# Patient Record
Sex: Male | Born: 1995 | Race: White | Hispanic: No | Marital: Single | Smoking: Current every day smoker
Health system: Southern US, Community
[De-identification: ages and names within clinical notes are randomized; demographics above are authoritative.]

## PROBLEM LIST (undated history)

## (undated) DIAGNOSIS — J45909 Unspecified asthma, uncomplicated: Secondary | ICD-10-CM

## (undated) DIAGNOSIS — K829 Disease of gallbladder, unspecified: Secondary | ICD-10-CM

## (undated) HISTORY — PX: APPENDECTOMY: SHX54

---
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
## 2019-02-08 ENCOUNTER — Other Ambulatory Visit: Payer: Self-pay

## 2019-06-01 ENCOUNTER — Other Ambulatory Visit: Payer: Self-pay

## 2019-06-01 ENCOUNTER — Encounter (HOSPITAL_COMMUNITY): Payer: Self-pay | Admitting: Emergency Medicine

## 2019-06-01 ENCOUNTER — Emergency Department (HOSPITAL_COMMUNITY): Payer: Self-pay

## 2019-06-01 ENCOUNTER — Emergency Department (HOSPITAL_COMMUNITY)
Admission: EM | Admit: 2019-06-01 | Discharge: 2019-06-01 | Disposition: A | Discharge status: Home / Self Care | Payer: Self-pay | Attending: Emergency Medicine | Admitting: Emergency Medicine

## 2019-06-01 DIAGNOSIS — R05 Cough: Secondary | ICD-10-CM | POA: Insufficient documentation

## 2019-06-01 DIAGNOSIS — Z7722 Contact with and (suspected) exposure to environmental tobacco smoke (acute) (chronic): Secondary | ICD-10-CM | POA: Insufficient documentation

## 2019-06-01 DIAGNOSIS — J45901 Unspecified asthma with (acute) exacerbation: Secondary | ICD-10-CM | POA: Insufficient documentation

## 2019-06-01 LAB — CBC
HCT: 45.1 % (ref 39.0–52.0)
Hemoglobin: 14 g/dL (ref 13.0–17.0)
MCH: 28.2 pg (ref 26.0–34.0)
MCHC: 31 g/dL (ref 30.0–36.0)
MCV: 90.7 fL (ref 80.0–100.0)
Platelets: 360 10*3/uL (ref 150–400)
RBC: 4.97 MIL/uL (ref 4.22–5.81)
RDW: 13.7 % (ref 11.5–15.5)
WBC: 8.3 10*3/uL (ref 4.0–10.5)
nRBC: 0 % (ref 0.0–0.2)

## 2019-06-01 LAB — BASIC METABOLIC PANEL
Anion gap: 9 (ref 5–15)
BUN: 6 mg/dL (ref 6–20)
CO2: 30 mmol/L (ref 22–32)
Calcium: 9.5 mg/dL (ref 8.9–10.3)
Chloride: 101 mmol/L (ref 98–111)
Creatinine, Ser: 0.64 mg/dL (ref 0.61–1.24)
GFR calc Af Amer: >60 mL/min (ref >60)
GFR calc non Af Amer: >60 mL/min (ref >60)
Glucose, Bld: 85 mg/dL (ref 70–99)
Potassium: 4 mmol/L (ref 3.5–5.1)
Sodium: 140 mmol/L (ref 135–145)

## 2019-06-01 LAB — TROPONIN I (HIGH SENSITIVITY)
Troponin I (High Sensitivity): 3 ng/L (ref <18)
Troponin I (High Sensitivity): 4 ng/L (ref <18)

## 2019-06-01 LAB — D-DIMER, QUANTITATIVE: D-Dimer, Quant: <0.27 ug/mL-FEU (ref 0.00–0.50)

## 2019-06-01 MED ORDER — SODIUM CHLORIDE 0.9% FLUSH
3.0000 mL | Freq: Once | INTRAVENOUS | Status: AC
  Administered 2019-06-01: 3 mL via INTRAVENOUS

## 2019-06-01 MED ORDER — ALBUTEROL SULFATE HFA 108 (90 BASE) MCG/ACT IN AERS
4.0000 | INHALATION_SPRAY | Freq: Once | RESPIRATORY_TRACT | Status: AC
  Administered 2019-06-01: 4 via RESPIRATORY_TRACT
  Filled 2019-06-01: qty 6.7

## 2019-06-01 MED ORDER — ALBUTEROL SULFATE HFA 108 (90 BASE) MCG/ACT IN AERS
2.0000 | INHALATION_SPRAY | RESPIRATORY_TRACT | 0 refills | Status: DC | PRN
Start: 2019-06-01 — End: 2020-04-10

## 2019-06-01 MED ORDER — AEROCHAMBER PLUS FLO-VU LARGE MISC
Status: AC
  Filled 2019-06-01: qty 1

## 2019-06-01 MED ORDER — PREDNISONE 20 MG PO TABS
40.0000 mg | ORAL_TABLET | Freq: Every day | ORAL | 0 refills | Status: AC
Start: 2019-06-01 — End: 2019-06-06

## 2019-06-01 NOTE — ED Triage Notes (Signed)
Pt arrives to ED with c/o of breathing difficulty for over last 2 weeks was tested negative for covid yesterday. Pt has wheezing and states he was able to use a friend breathing treatment that did help but recently had to give it back. Pt denies any cp. Pt called ems out to his house today due to sob but they recommended he drive himself.   Pt also reports for the first time today he was was coughing up blood.

## 2019-06-01 NOTE — ED Notes (Signed)
Patient verbalizes understanding of discharge instructions. Opportunity for questioning and answers were provided. Armband removed by staff, pt discharged from ED to home 

## 2019-06-01 NOTE — ED Provider Notes (Signed)
[facility] EMERGENCY DEPARTMENT Provider Note   CSN: [id number] Arrival date & time: 06/01/19  1245     History Chief Complaint  Patient presents with  . Shortness of Breath    Dustin Velasquez is a 24 y.o. male.  Presents to ER with complaint of shortness of breath.  Patient states over the past couple months he has noted having intermittent difficulty in breathing, worse over the last couple weeks.  States that he had a Covid test performed yesterday that was negative.  States that he has wheezing and has been using friend's inhaler.  Some relief.  No associated chest pain.  Occasional cough, mostly nonproductive and nonbloody.  Denies any chronic medical conditions.  Denies prior history of asthma.  Smokes cigarettes, works in [employer].  HPI     Past Medical History:  Diagnosis Date  . Gallbladder attack     Patient Active Problem List   Diagnosis Date Noted  . Acute appendicitis 05/19/2014    Past Surgical History:  Procedure Laterality Date  . APPENDECTOMY     current r/c appe.  Marland Kitchen LAPAROSCOPIC APPENDECTOMY N/A 05/20/2014   Procedure: APPENDECTOMY LAPAROSCOPIC;  Surgeon: Donnie Mesa, MD;  Location: [place];  Service: General;  Laterality: N/A;       No family history on file.  Social History   Tobacco Use  . Smoking status: Passive Smoke Exposure - Never Smoker  . Smokeless tobacco: Never Used  Substance Use Topics  . Alcohol use: No  . Drug use: No    Home Medications Prior to Admission medications   Medication Sig Start Date End Date Taking? Authorizing Provider  albuterol (VENTOLIN HFA) 108 (90 Base) MCG/ACT inhaler Inhale 2 puffs into the lungs every 4 (four) hours as needed for wheezing or shortness of breath. 06/01/19   Lucrezia Starch, MD  ibuprofen (ADVIL,MOTRIN) 200 MG tablet Take 200 mg by mouth every 6 (six) hours as needed for moderate pain.    [provider]  ondansetron (ZOFRAN-ODT) 4 MG disintegrating tablet Take 1  tablet (4 mg total) by mouth stat. Patient not taking: Reported on 05/19/2014 07/01/13   Suezanne Jacquet, MD  oxyCODONE (OXY IR/ROXICODONE) 5 MG immediate release tablet Take 1 tablet (5 mg total) by mouth every 6 (six) hours as needed for moderate pain. 05/20/14   Nat Christen, PA-C  predniSONE (DELTASONE) 20 MG tablet Take 2 tablets (40 mg total) by mouth daily with breakfast for 5 days. 06/01/19 06/06/19  Lucrezia Starch, MD    Allergies    Patient has no known allergies.  Review of Systems   Review of Systems  Constitutional: Negative for chills and fever.  HENT: Negative for ear pain and sore throat.   Eyes: Negative for pain and visual disturbance.  Respiratory: Positive for cough and shortness of breath.   Cardiovascular: Negative for chest pain and palpitations.  Gastrointestinal: Negative for abdominal pain and vomiting.  Genitourinary: Negative for dysuria and hematuria.  Musculoskeletal: Negative for arthralgias and back pain.  Skin: Negative for color change and rash.  Neurological: Negative for seizures and syncope.  All other systems reviewed and are negative.   Physical Exam Updated Vital Signs BP 116/79   Pulse 84   Temp 97.6 F (36.4 C) (Oral)   Resp 13   Ht 5\' 2"  (1.575 m)   Wt 54.4 kg   SpO2 100%   BMI 21.95 kg/m   Physical Exam Vitals and nursing note reviewed.  Constitutional:      Appearance: He is well-developed.  HENT:     Head: Normocephalic and atraumatic.  Eyes:     Conjunctiva/sclera: Conjunctivae normal.  Cardiovascular:     Rate and Rhythm: Normal rate and regular rhythm.     Heart sounds: No murmur.  Pulmonary:     Comments: No respiratory distress, speaks in full sentences, no accessory muscle use, there is bilateral expiratory wheeze, no crackles Abdominal:     Palpations: Abdomen is soft.     Tenderness: There is no abdominal tenderness.  Musculoskeletal:     Cervical back: Neck supple.     Right lower leg: No edema.     Left  lower leg: No edema.  Skin:    General: Skin is warm and dry.  Neurological:     General: No focal deficit present.     Mental Status: He is alert and oriented to person, place, and time.     ED Results / Procedures / Treatments   Labs (all labs ordered are listed, but only abnormal results are displayed) Labs Reviewed  BASIC METABOLIC PANEL  CBC  D-DIMER, QUANTITATIVE (NOT AT [facility])  TROPONIN I (HIGH SENSITIVITY)  TROPONIN I (HIGH SENSITIVITY)    EKG EKG Interpretation  Date/Time:  Tuesday Jun 01 2019 10:04:30 EDT Ventricular Rate:  102 PR Interval:  130 QRS Duration: 74 QT Interval:  332 QTC Calculation: 432 R Axis:   78 Text Interpretation: Sinus tachycardia Otherwise normal ECG Confirmed by Marianna Fuss ([id number]) on 06/01/2019 3:07:16 PM   Radiology DG Chest 2 View  Result Date: 06/01/2019 CLINICAL DATA:  24 year old male with cough and shortness of breath for 2 months. Smoker/vapor. EXAM: CHEST - 2 VIEW COMPARISON:  CT Abdomen and Pelvis 05/19/2014. FINDINGS: Large lung volumes. Normal cardiac size and mediastinal contours. Visualized tracheal air column is within normal limits. No pneumothorax, pulmonary edema, pleural effusion or confluent pulmonary opacity. Mild diffuse increased pulmonary interstitial markings with a perihilar predominance. No acute osseous abnormality identified. Negative visible bowel gas pattern. IMPRESSION: Hyperinflated lungs. Mild prominence of the pulmonary interstitium likely secondary to smoking. No acute cardiopulmonary abnormality. Electronically Signed   By: Odessa Fleming M.D.   On: 06/01/2019 11:22    Procedures Procedures (including critical care time)  Medications Ordered in ED Medications  sodium chloride flush (NS) 0.9 % injection 3 mL (3 mLs Intravenous Given 06/01/19 1530)  albuterol (VENTOLIN HFA) 108 (90 Base) MCG/ACT inhaler 4 puff (4 puffs Inhalation Given 06/01/19 1545)  AeroChamber Plus Flo-Vu Large MISC (  Given 06/01/19 1545)      ED Course  I have reviewed the triage vital signs and the nursing notes.  Pertinent labs & imaging results that were available during my care of the patient were reviewed by me and considered in my medical decision making (see chart for details).    MDM Rules/Calculators/A&P                      24 year old male presents to ER with cough, shortness of breath.  On exam patient was well-appearing in no distress, vital signs were stable, no hypoxia, on auscultation noted significant expiratory wheeze bilateral.  Suspect patient symptoms are most likely secondary to asthma, reactive airway disease.  Patient denies being diagnosed with this previously. Gave albuterol with spacer here and had significant improvement. Notable social history smoker and works in [employer].  CXR negative for pneumonia.  D-dimer within normal limits, doubt PE.  EKG without  ischemic changes, troponin x2 within normal limits, doubt ACS.  Will give patient Rx for albuterol, steroid course.  Strongly recommended recheck with a primary care doctor, instructed return to ER for worsening symptoms. At present appropriate for out pt management.    After the discussed management above, the patient was determined to be safe for discharge.  The patient was in agreement with this plan and all questions regarding their care were answered.  ED return precautions were discussed and the patient will return to the ED with any significant worsening of condition.   Final Clinical Impression(s) / ED Diagnoses Final diagnoses:  Exacerbation of asthma, unspecified asthma severity, unspecified whether persistent    Rx / DC Orders ED Discharge Orders         Ordered    albuterol (VENTOLIN HFA) 108 (90 Base) MCG/ACT inhaler  Every 4 hours PRN     06/01/19 1644    predniSONE (DELTASONE) 20 MG tablet  Daily with breakfast     06/01/19 1644           Milagros Loll, MD 06/01/19 1649

## 2019-06-01 NOTE — Discharge Instructions (Signed)
Strongly recommend obtaining follow-up appointment with a primary care doctor.  If you do not have 1, in the interim you can follow-up with [facility] as in discharge instructions.  Take steroids as prescribed.  Use inhaler as needed.  If you feel your breathing worsens, you develop fever, other new concerning symptom, return to ER for reassessment.

## 2020-03-14 ENCOUNTER — Other Ambulatory Visit: Payer: Self-pay

## 2020-03-14 ENCOUNTER — Emergency Department: Payer: Self-pay

## 2020-03-14 ENCOUNTER — Emergency Department
Admission: EM | Admit: 2020-03-14 | Discharge: 2020-03-14 | Disposition: A | Discharge status: Home / Self Care | Payer: Self-pay | Attending: Student in an Organized Health Care Education/Training Program | Admitting: Student in an Organized Health Care Education/Training Program

## 2020-03-14 DIAGNOSIS — F17200 Nicotine dependence, unspecified, uncomplicated: Secondary | ICD-10-CM | POA: Insufficient documentation

## 2020-03-14 DIAGNOSIS — Z20822 Contact with and (suspected) exposure to covid-19: Secondary | ICD-10-CM | POA: Insufficient documentation

## 2020-03-14 DIAGNOSIS — J4521 Mild intermittent asthma with (acute) exacerbation: Secondary | ICD-10-CM | POA: Insufficient documentation

## 2020-03-14 HISTORY — DX: Unspecified asthma, uncomplicated: J45.909

## 2020-03-14 LAB — RESP PANEL BY RT-PCR (FLU A&B, COVID) ARPGX2
Influenza A by PCR: NEGATIVE
Influenza B by PCR: NEGATIVE
SARS Coronavirus 2 by RT PCR: NEGATIVE

## 2020-03-14 MED ORDER — ALBUTEROL SULFATE HFA 108 (90 BASE) MCG/ACT IN AERS
2.0000 | INHALATION_SPRAY | Freq: Four times a day (QID) | RESPIRATORY_TRACT | 2 refills | Status: DC | PRN
Start: 2020-03-14 — End: 2020-04-10

## 2020-03-14 MED ORDER — SODIUM CHLORIDE 0.9 % IV BOLUS: 1000.0000 mL | Freq: Once | INTRAVENOUS | Status: DC

## 2020-03-14 MED ORDER — PREDNISONE 10 MG PO TABS
10.0000 mg | ORAL_TABLET | Freq: Every day | ORAL | 0 refills | Status: DC
Start: 2020-03-14 — End: 2020-04-10

## 2020-03-14 MED ORDER — PREDNISONE 20 MG PO TABS
40.0000 mg | ORAL_TABLET | Freq: Every day | ORAL | 0 refills | Status: DC
Start: 2020-03-14 — End: 2020-03-14

## 2020-03-14 MED ORDER — IPRATROPIUM-ALBUTEROL 0.5-2.5 (3) MG/3ML IN SOLN
3.0000 mL | Freq: Once | RESPIRATORY_TRACT | Status: DC
  Filled 2020-03-14: qty 3

## 2020-03-14 MED ORDER — ALBUTEROL SULFATE (2.5 MG/3ML) 0.083% IN NEBU: 2.5000 mg | INHALATION_SOLUTION | Freq: Once | RESPIRATORY_TRACT | Status: DC

## 2020-03-14 MED ORDER — ALBUTEROL SULFATE HFA 108 (90 BASE) MCG/ACT IN AERS
2.0000 | INHALATION_SPRAY | RESPIRATORY_TRACT | Status: DC | PRN
  Administered 2020-03-14: 2 via RESPIRATORY_TRACT
  Filled 2020-03-14: qty 6.7

## 2020-03-14 NOTE — ED Notes (Signed)
Pt declined breathing treatment at this time.  Pt states he does not feel like he needs breathing tx at this time.  Dr. Roxan Hockey made aware. Will continue to monitor.

## 2020-03-14 NOTE — ED Triage Notes (Signed)
Pt arrives [facility] for asthma attack. SOB began last night. Pt has been using other peoples inhalers because he ran out. Received multiple breathing treatments (2 albuterol's, 2 atrovent's). + smoker and vaper. 20g IV to L AC. 125mg  solumedrol, 2g magnesium. sats were never low per EMS. No cyanosis but pt was not able to talk in complete sentences.

## 2020-03-14 NOTE — ED Notes (Signed)
Pt ambulated in hallway.  O2 sats 96%, HR 125 while ambulating.

## 2020-03-14 NOTE — ED Provider Notes (Signed)
[facility] Emergency Department Provider Note    Event Date/Time   First MD Initiated Contact with Patient 03/14/20 1713     (approximate)  I have reviewed the triage vital signs and the nursing notes.   HISTORY  Chief Complaint Asthma    HPI Dustin Velasquez is a 25 y.o. male with a history of asthma presents to the ER for shortness of breath and feeling of his having an asthma exacerbation patient does smoke and vape.  He denies any pain.  Has run through his albuterol at home.  Denies any fevers.  Did start with cough.  Cannot identify any exacerbating factors or exposures.   Patient was brought in by EMS received nebulizer as well as IV Solu-Medrol and IV magnesium.   Past Medical History:  Diagnosis Date  . Asthma   . Gallbladder attack    History reviewed. No pertinent family history. Past Surgical History:  Procedure Laterality Date  . APPENDECTOMY     current r/c appe.  Marland Kitchen LAPAROSCOPIC APPENDECTOMY N/A 05/20/2014   Procedure: APPENDECTOMY LAPAROSCOPIC;  Surgeon: Manus Rudd, MD;  Location: [facility] OR;  Service: General;  Laterality: N/A;   Patient Active Problem List   Diagnosis Date Noted  . Acute appendicitis 05/19/2014      Prior to Admission medications   Medication Sig Start Date End Date Taking? Authorizing Provider  albuterol (VENTOLIN HFA) 108 (90 Base) MCG/ACT inhaler Inhale 2 puffs into the lungs every 6 (six) hours as needed for wheezing or shortness of breath. 03/14/20  Yes Willy Eddy, MD  predniSONE (DELTASONE) 10 MG tablet Take 1 tablet (10 mg total) by mouth daily. Day 1-2: Take 50 mg  ( 5 pills) Day 3-4 : Take 40 mg (4pills) Day 5-6: Take 30 mg (3 pills) Day 7-8:  Take 20 mg (2 pills) Day 9:  Take 10mg  (1 pill) 03/14/20  Yes 03/16/20, MD  albuterol (VENTOLIN HFA) 108 (90 Base) MCG/ACT inhaler Inhale 2 puffs into the lungs every 4 (four) hours as needed for wheezing or shortness of breath. 06/01/19   08/01/19, MD  ibuprofen (ADVIL,MOTRIN) 200 MG tablet Take 200 mg by mouth every 6 (six) hours as needed for moderate pain.    [provider]  ondansetron (ZOFRAN-ODT) 4 MG disintegrating tablet Take 1 tablet (4 mg total) by mouth stat. Patient not taking: Reported on 05/19/2014 07/01/13   08/31/13, MD  oxyCODONE (OXY IR/ROXICODONE) 5 MG immediate release tablet Take 1 tablet (5 mg total) by mouth every 6 (six) hours as needed for moderate pain. 05/20/14   05/22/14, PA-C    Allergies Patient has no known allergies.    Social History Social History   Tobacco Use  . Smoking status: Current Every Day Smoker  . Smokeless tobacco: Never Used  Vaping Use  . Vaping Use: Every day  Substance Use Topics  . Alcohol use: No  . Drug use: No    Review of Systems Patient denies headaches, rhinorrhea, blurry vision, numbness, shortness of breath, chest pain, edema, cough, abdominal pain, nausea, vomiting, diarrhea, dysuria, fevers, rashes or hallucinations unless otherwise stated above in HPI. ____________________________________________   PHYSICAL EXAM:  VITAL SIGNS: Vitals:   03/14/20 1900 03/14/20 1930  BP: 123/85 122/72  Pulse: (!) 117 (!) 117  Resp: 20 20  Temp:    SpO2: 98% 100%    Constitutional: Alert and oriented.  Eyes: Conjunctivae are normal.  Head: Atraumatic. Nose: No  congestion/rhinnorhea. Mouth/Throat: Mucous membranes are moist.   Neck: No stridor. Painless ROM.  Cardiovascular: Normal rate, regular rhythm. Grossly normal heart sounds.  Good peripheral circulation. Respiratory: Tachypnea with prolonged expiratory phase with diffuse musical wheezing. Gastrointestinal: Soft and nontender. No distention. No abdominal bruits. No CVA tenderness. Genitourinary:  Musculoskeletal: No lower extremity tenderness nor edema.  No joint effusions. Neurologic:  Normal speech and language. No gross focal neurologic deficits are appreciated. No facial droop Skin:   Skin is warm, dry and intact. No rash noted. Psychiatric: Mood and affect are normal. Speech and behavior are normal.  ____________________________________________   LABS (all labs ordered are listed, but only abnormal results are displayed)  Results for orders placed or performed during the hospital encounter of 03/14/20 (from the past 24 hour(s))  Resp Panel by RT-PCR (Flu A&B, Covid) Nasopharyngeal Swab     Status: None   Collection Time: 03/14/20  6:25 PM   Specimen: Nasopharyngeal Swab; Nasopharyngeal(NP) swabs in vial transport medium  Result Value Ref Range   SARS Coronavirus 2 by RT PCR NEGATIVE NEGATIVE   Influenza A by PCR NEGATIVE NEGATIVE   Influenza B by PCR NEGATIVE NEGATIVE   ____________________________________________  EKG My review and personal interpretation at Time: 17:13   Indication: sob  Rate: 135  Rhythm: sinus Axis: normal Other: normal intervals, no stemi ____________________________________________  RADIOLOGY  I personally reviewed all radiographic images ordered to evaluate for the above acute complaints and reviewed radiology reports and findings.  These findings were personally discussed with the patient.  Please see medical record for radiology report.  ____________________________________________   PROCEDURES  Procedure(s) performed:  Procedures    Critical Care performed: no ____________________________________________   INITIAL IMPRESSION / ASSESSMENT AND PLAN / ED COURSE  Pertinent labs & imaging results that were available during my care of the patient were reviewed by me and considered in my medical decision making (see chart for details).   DDX: asthma , ptx, pna, pe, covid  Dustin Velasquez is a 25 y.o. who presents to the ED with symptoms as described above.  Presentation is consistent with asthma exacerbation.  He is improving already after treatment started with EMS.  Will give additional neb.  Will observe.  Will order chest  x-ray and Covid.  Clinical Course as of 03/14/20 2000  Tue Mar 14, 2020  1831 Patient reassessed.  Feels improved.  We will continue to observe. [PR]  1831 Chest x-ray upon arrival reviewed does not show any evidence of pneumothorax.  Does show hyperexpanded lungs. [PR]  1956 Patient feels significantly improved.  Respirations are even and unlabored.  Has occasional scattered wheeze.  He states that he feels "100% better.  "This point he was stable and appropriate for discharge home. [PR]    Clinical Course User Index [PR] Willy Eddy, MD    The patient was evaluated in Emergency Department today for the symptoms described in the history of present illness. He/she was evaluated in the context of the global COVID-19 pandemic, which necessitated consideration that the patient might be at risk for infection with the SARS-CoV-2 virus that causes COVID-19. Institutional protocols and algorithms that pertain to the evaluation of patients at risk for COVID-19 are in a state of rapid change based on information released by regulatory bodies including the CDC and federal and state organizations. These policies and algorithms were followed during the patient's care in the ED.  As part of my medical decision making, I reviewed the following data within  the electronic MEDICAL RECORD NUMBER Nursing notes reviewed and incorporated, Labs reviewed, notes from prior ED visits and [place] Controlled Substance Database   ____________________________________________   FINAL CLINICAL IMPRESSION(S) / ED DIAGNOSES  Final diagnoses:  Mild intermittent asthma with exacerbation      NEW MEDICATIONS STARTED DURING THIS VISIT:  New Prescriptions   ALBUTEROL (VENTOLIN HFA) 108 (90 BASE) MCG/ACT INHALER    Inhale 2 puffs into the lungs every 6 (six) hours as needed for wheezing or shortness of breath.   PREDNISONE (DELTASONE) 10 MG TABLET    Take 1 tablet (10 mg total) by mouth daily. Day 1-2: Take 50 mg  ( 5 pills)  Day 3-4 : Take 40 mg (4pills) Day 5-6: Take 30 mg (3 pills) Day 7-8:  Take 20 mg (2 pills) Day 9:  Take 10mg  (1 pill)     Note:  This document was prepared using Dragon voice recognition software and may include unintentional dictation errors.    , MD 03/14/20 03/16/20

## 2020-04-10 ENCOUNTER — Emergency Department: Payer: Medicaid Other

## 2020-04-10 ENCOUNTER — Other Ambulatory Visit: Payer: Self-pay

## 2020-04-10 ENCOUNTER — Encounter: Payer: Self-pay | Admitting: Emergency Medicine

## 2020-04-10 ENCOUNTER — Emergency Department
Admission: EM | Admit: 2020-04-10 | Discharge: 2020-04-10 | Disposition: A | Discharge status: Home / Self Care | Payer: Medicaid Other | Attending: Emergency Medicine | Admitting: Emergency Medicine

## 2020-04-10 DIAGNOSIS — F17200 Nicotine dependence, unspecified, uncomplicated: Secondary | ICD-10-CM | POA: Insufficient documentation

## 2020-04-10 DIAGNOSIS — R0602 Shortness of breath: Secondary | ICD-10-CM

## 2020-04-10 DIAGNOSIS — J4520 Mild intermittent asthma, uncomplicated: Secondary | ICD-10-CM

## 2020-04-10 MED ORDER — IPRATROPIUM-ALBUTEROL 0.5-2.5 (3) MG/3ML IN SOLN
3.0000 mL | Freq: Once | RESPIRATORY_TRACT | Status: AC
  Administered 2020-04-10: 3 mL via RESPIRATORY_TRACT
  Filled 2020-04-10: qty 3

## 2020-04-10 MED ORDER — PREDNISONE 20 MG PO TABS
60.0000 mg | ORAL_TABLET | Freq: Once | ORAL | Status: AC
  Administered 2020-04-10: 60 mg via ORAL
  Filled 2020-04-10: qty 3

## 2020-04-10 MED ORDER — ALBUTEROL SULFATE HFA 108 (90 BASE) MCG/ACT IN AERS
2.0000 | INHALATION_SPRAY | RESPIRATORY_TRACT | Status: DC | PRN
  Filled 2020-04-10 (×2): qty 6.7

## 2020-04-10 MED ORDER — PREDNISONE 10 MG PO TABS
ORAL_TABLET | ORAL | 0 refills | Status: AC
Start: 2020-04-10 — End: 2020-04-21

## 2020-04-10 MED ORDER — FEXOFENADINE HCL 180 MG PO TABS: 180.0000 mg | ORAL_TABLET | Freq: Every day | ORAL | 0 refills | Status: AC

## 2020-04-10 MED ORDER — ALBUTEROL SULFATE (2.5 MG/3ML) 0.083% IN NEBU: 2.5000 mg | INHALATION_SOLUTION | RESPIRATORY_TRACT | 0 refills | Status: AC | PRN

## 2020-04-10 NOTE — ED Provider Notes (Signed)
[facility] Emergency Department Provider Note  ____________________________________________   Event Date/Time   First MD Initiated Contact with Patient 04/10/20 1354     (approximate)  I have reviewed the triage vital signs and the nursing notes.   HISTORY  Chief Complaint Asthma    HPI Dustin Velasquez is a 25 y.o. male with history of asthma here with cough and shortness of breath.  The patient states that his symptoms started over the last several days.  He was seen approximately a month ago and given prednisone and albuterol for asthma.  He states that his symptoms have initially improved after the steroids but returned shortly thereafter.  He is had persistent wheezing since then.  Has been using his inhaler which has intermittently helped, but has stopped providing much help over the last few days.  He states that he also ran out of his inhaler approximately 2 days ago.  Of note, this all began after he moved into a new home which he feels like he has black mold.  Denies any fevers or sputum production.  He denies any prior significant hospitalizations for asthma.  Is not have a primary care doctor.  No lower extremity swelling.  No other complaints.        Past Medical History:  Diagnosis Date  . Asthma   . Gallbladder attack     Patient Active Problem List   Diagnosis Date Noted  . Acute appendicitis 05/19/2014    Past Surgical History:  Procedure Laterality Date  . APPENDECTOMY     current r/c appe.  Marland Kitchen LAPAROSCOPIC APPENDECTOMY N/A 05/20/2014   Procedure: APPENDECTOMY LAPAROSCOPIC;  Surgeon: Manus Rudd, MD;  Location: [facility] OR;  Service: General;  Laterality: N/A;    Prior to Admission medications   Medication Sig Start Date End Date Taking? Authorizing Provider  albuterol (PROVENTIL) (2.5 MG/3ML) 0.083% nebulizer solution Take 3 mLs (2.5 mg total) by nebulization every 4 (four) hours as needed for wheezing or shortness of breath. 04/10/20  04/10/21 Yes Shaune Pollack, MD  fexofenadine [facility] ALLERGY) 180 MG tablet Take 1 tablet (180 mg total) by mouth daily. 04/10/20 05/10/20 Yes Shaune Pollack, MD  predniSONE (DELTASONE) 10 MG tablet Take 6 tablets (60 mg total) by mouth daily for 2 days, THEN 4 tablets (40 mg total) daily for 3 days, THEN 2 tablets (20 mg total) daily for 3 days, THEN 1 tablet (10 mg total) daily for 3 days. 04/10/20 04/21/20 Yes Shaune Pollack, MD  ibuprofen (ADVIL,MOTRIN) 200 MG tablet Take 200 mg by mouth every 6 (six) hours as needed for moderate pain.    [provider]  ondansetron (ZOFRAN-ODT) 4 MG disintegrating tablet Take 1 tablet (4 mg total) by mouth stat. Patient not taking: Reported on 05/19/2014 07/01/13   Saverio Danker, MD  oxyCODONE (OXY IR/ROXICODONE) 5 MG immediate release tablet Take 1 tablet (5 mg total) by mouth every 6 (six) hours as needed for moderate pain. 05/20/14   Nonie Hoyer, PA-C    Allergies Patient has no known allergies.  History reviewed. No pertinent family history.  Social History Social History   Tobacco Use  . Smoking status: Current Every Day Smoker  . Smokeless tobacco: Never Used  Vaping Use  . Vaping Use: Every day  Substance Use Topics  . Alcohol use: No  . Drug use: No    Review of Systems  Review of Systems  Constitutional: Negative for chills, fatigue and fever.  HENT: Negative for  sore throat.   Respiratory: Positive for cough, shortness of breath and wheezing.   Cardiovascular: Negative for chest pain.  Gastrointestinal: Negative for abdominal pain.  Genitourinary: Negative for flank pain.  Musculoskeletal: Negative for neck pain.  Skin: Negative for rash and wound.  Allergic/Immunologic: Negative for immunocompromised state.  Neurological: Negative for weakness and numbness.  Hematological: Does not bruise/bleed easily.  All other systems reviewed and are negative.    ____________________________________________  PHYSICAL EXAM:       VITAL SIGNS: ED Triage Vitals  Enc Vitals Group     BP 04/10/20 1404 115/83     Pulse Rate 04/10/20 1404 81     Resp 04/10/20 1404 17     Temp 04/10/20 1404 98.3 F (36.8 C)     Temp Source 04/10/20 1404 Oral     SpO2 04/10/20 1404 93 %     Weight 04/10/20 1402 120 lb (54.4 kg)     Height 04/10/20 1402 5\' 3"  (1.6 m)     Head Circumference --      Peak Flow --      Pain Score 04/10/20 1402 0     Pain Loc --      Pain Edu? --      Excl. in [facility]? --      Physical Exam Vitals and nursing note reviewed.  Constitutional:      General: He is not in acute distress.    Appearance: He is well-developed.  HENT:     Head: Normocephalic and atraumatic.  Eyes:     Conjunctiva/sclera: Conjunctivae normal.  Cardiovascular:     Rate and Rhythm: Normal rate and regular rhythm.     Heart sounds: Normal heart sounds. No murmur heard. No friction rub.  Pulmonary:     Effort: Pulmonary effort is normal. No respiratory distress.     Breath sounds: Wheezing (Diffuse, expiratory) present. No rales.     Comments: Diffuse wheezes, normal work of breathing, speaking in full sentences Abdominal:     General: There is no distension.     Palpations: Abdomen is soft.     Tenderness: There is no abdominal tenderness.  Musculoskeletal:     Cervical back: Neck supple.  Skin:    General: Skin is warm.     Capillary Refill: Capillary refill takes less than 2 seconds.     Findings: No rash.  Neurological:     Mental Status: He is alert and oriented to person, place, and time.     Motor: No abnormal muscle tone.       ____________________________________________   LABS (all labs ordered are listed, but only abnormal results are displayed)  Labs Reviewed - No data to display  ____________________________________________  EKG: Normal sinus rhythm, ventricular rate 87.  QRS 85, QTc 447.  Benign early repole.  No acute ST  elevations. ________________________________________  RADIOLOGY All imaging, including plain films, CT scans, and ultrasounds, independently reviewed by me, and interpretations confirmed via formal radiology reads.  ED MD interpretation:   Chest x-ray: Reviewed, clear  Official radiology report(s): DG Chest 2 View  Result Date: 04/10/2020 CLINICAL DATA:  Shortness of breath and chest pain EXAM: CHEST - 2 VIEW COMPARISON:  March 14, 2020 FINDINGS: The lungs are clear. The heart size and pulmonary vascularity are normal. No adenopathy. No pneumothorax. No bone lesions. IMPRESSION: Lungs clear.  Cardiac silhouette normal. Electronically Signed   By: March 16, 2020 III M.D.   On: 04/10/2020 14:48    ____________________________________________  PROCEDURES   Procedure(s) performed (including Critical Care):  Procedures  ____________________________________________  INITIAL IMPRESSION / MDM / ASSESSMENT AND PLAN / ED COURSE  As part of my medical decision making, I reviewed the following data within the electronic MEDICAL RECORD NUMBER Nursing notes reviewed and incorporated, Old chart reviewed, Notes from prior ED visits, and [place] Controlled Substance Database       *Dustin Velasquez was evaluated in Emergency Department on 04/10/2020 for the symptoms described in the history of present illness. He was evaluated in the context of the global COVID-19 pandemic, which necessitated consideration that the patient might be at risk for infection with the SARS-CoV-2 virus that causes COVID-19. Institutional protocols and algorithms that pertain to the evaluation of patients at risk for COVID-19 are in a state of rapid change based on information released by regulatory bodies including the CDC and federal and state organizations. These policies and algorithms were followed during the patient's care in the ED.  Some ED evaluations and interventions may be delayed as a result of limited staffing during  the pandemic.*     Medical Decision Making:  25 yo M here with SOB. Suspect recurrent asthma exacerbation. He is not hypoxic, has normal WOB and is in NAD. CXR reviewed and is clear. EKG nonischemic. No s/s to suggest PE, CHF, or alternative etiology. Interestingly, sx all began after moving to a new apartment 6 months ago - query mold or other new allergen exposure. Discussed that he should bring this up with his landlord. Otherwise, will give a longer steroid taper, inhaler, and d/c with outpt follow-up.  ____________________________________________  FINAL CLINICAL IMPRESSION(S) / ED DIAGNOSES  Final diagnoses:  Mild intermittent reactive airway disease without complication     MEDICATIONS GIVEN DURING THIS VISIT:  Medications  albuterol (VENTOLIN HFA) 108 (90 Base) MCG/ACT inhaler 2 puff (has no administration in time range)  ipratropium-albuterol (DUONEB) 0.5-2.5 (3) MG/3ML nebulizer solution 3 mL (3 mLs Nebulization Given 04/10/20 1450)  ipratropium-albuterol (DUONEB) 0.5-2.5 (3) MG/3ML nebulizer solution 3 mL (3 mLs Nebulization Given 04/10/20 1450)  ipratropium-albuterol (DUONEB) 0.5-2.5 (3) MG/3ML nebulizer solution 3 mL (3 mLs Nebulization Given 04/10/20 1449)  predniSONE (DELTASONE) tablet 60 mg (60 mg Oral Given 04/10/20 1448)     ED Discharge Orders         Ordered    predniSONE (DELTASONE) 10 MG tablet        04/10/20 1528    albuterol (PROVENTIL) (2.5 MG/3ML) 0.083% nebulizer solution  Every 4 hours PRN        04/10/20 1528    fexofenadine (ALLEGRA ALLERGY) 180 MG tablet  Daily        04/10/20 1528           Note:  This document was prepared using Dragon voice recognition software and may include unintentional dictation errors.   Shaune Pollack, MD 04/10/20 1536

## 2020-04-10 NOTE — ED Triage Notes (Signed)
Pt via [facility] from home. Pt called out here for an asthma attack. Pt was recently seen and d/c with albuterol and prednisone. Fire gave pt one albuterol treatment.Pt states once he finished the prednisone his breathing gets worse. Pt does take neb treatments at home that relieves his SOB shortly but it comes back. Unknown of what triggers his asthma attacks. Pt is A&Ox4 and NAD.

## 2020-04-10 NOTE — ED Notes (Signed)
Called pharmacy in regards of pt meds, will send up soon.

## 2020-09-29 IMAGING — DX DG CHEST 2V
2 series · 2 of 2 positions shown · non-contrast
Comparison: CT Abdomen and Pelvis 05/19/2014.

CLINICAL DATA: 24-year-old male with cough and shortness of breath
for 2 months. Smoker/vapor.

EXAM:
CHEST - 2 VIEW

[chest pa]
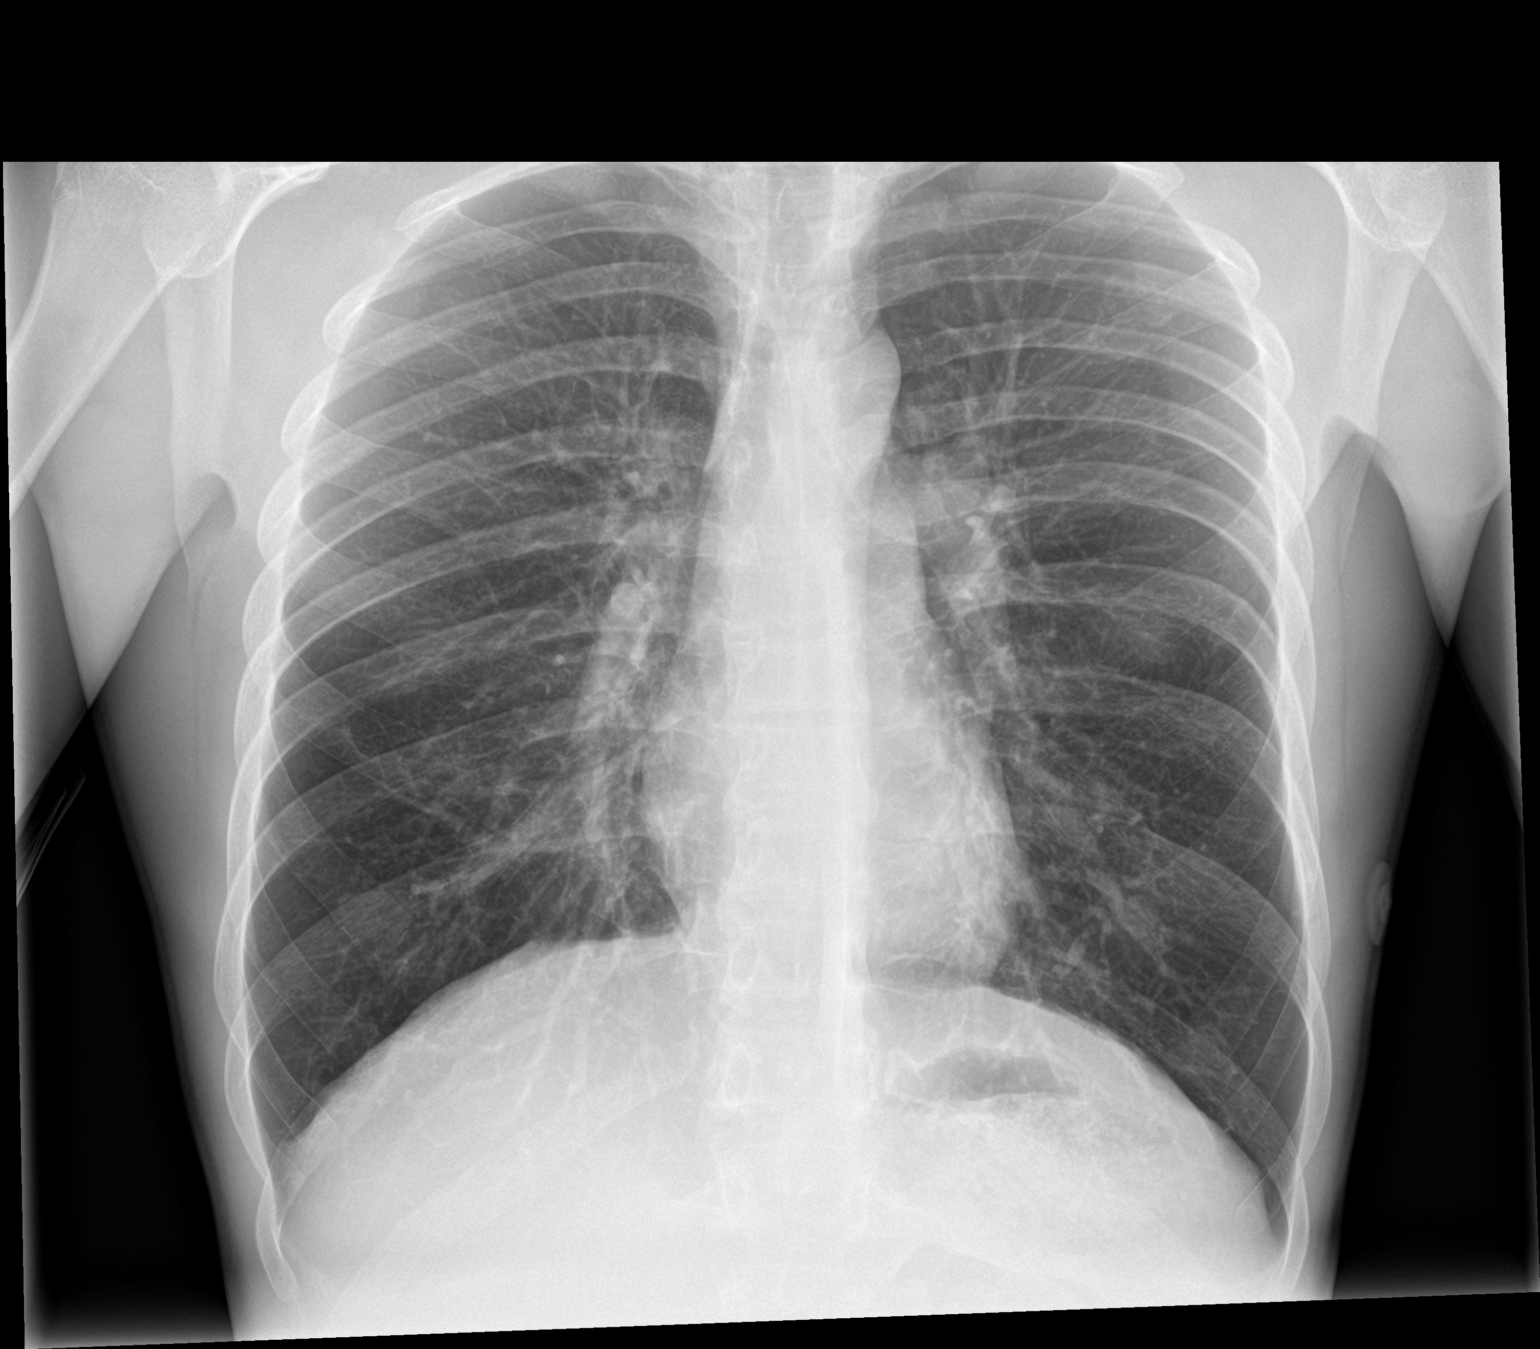

[chest lat]
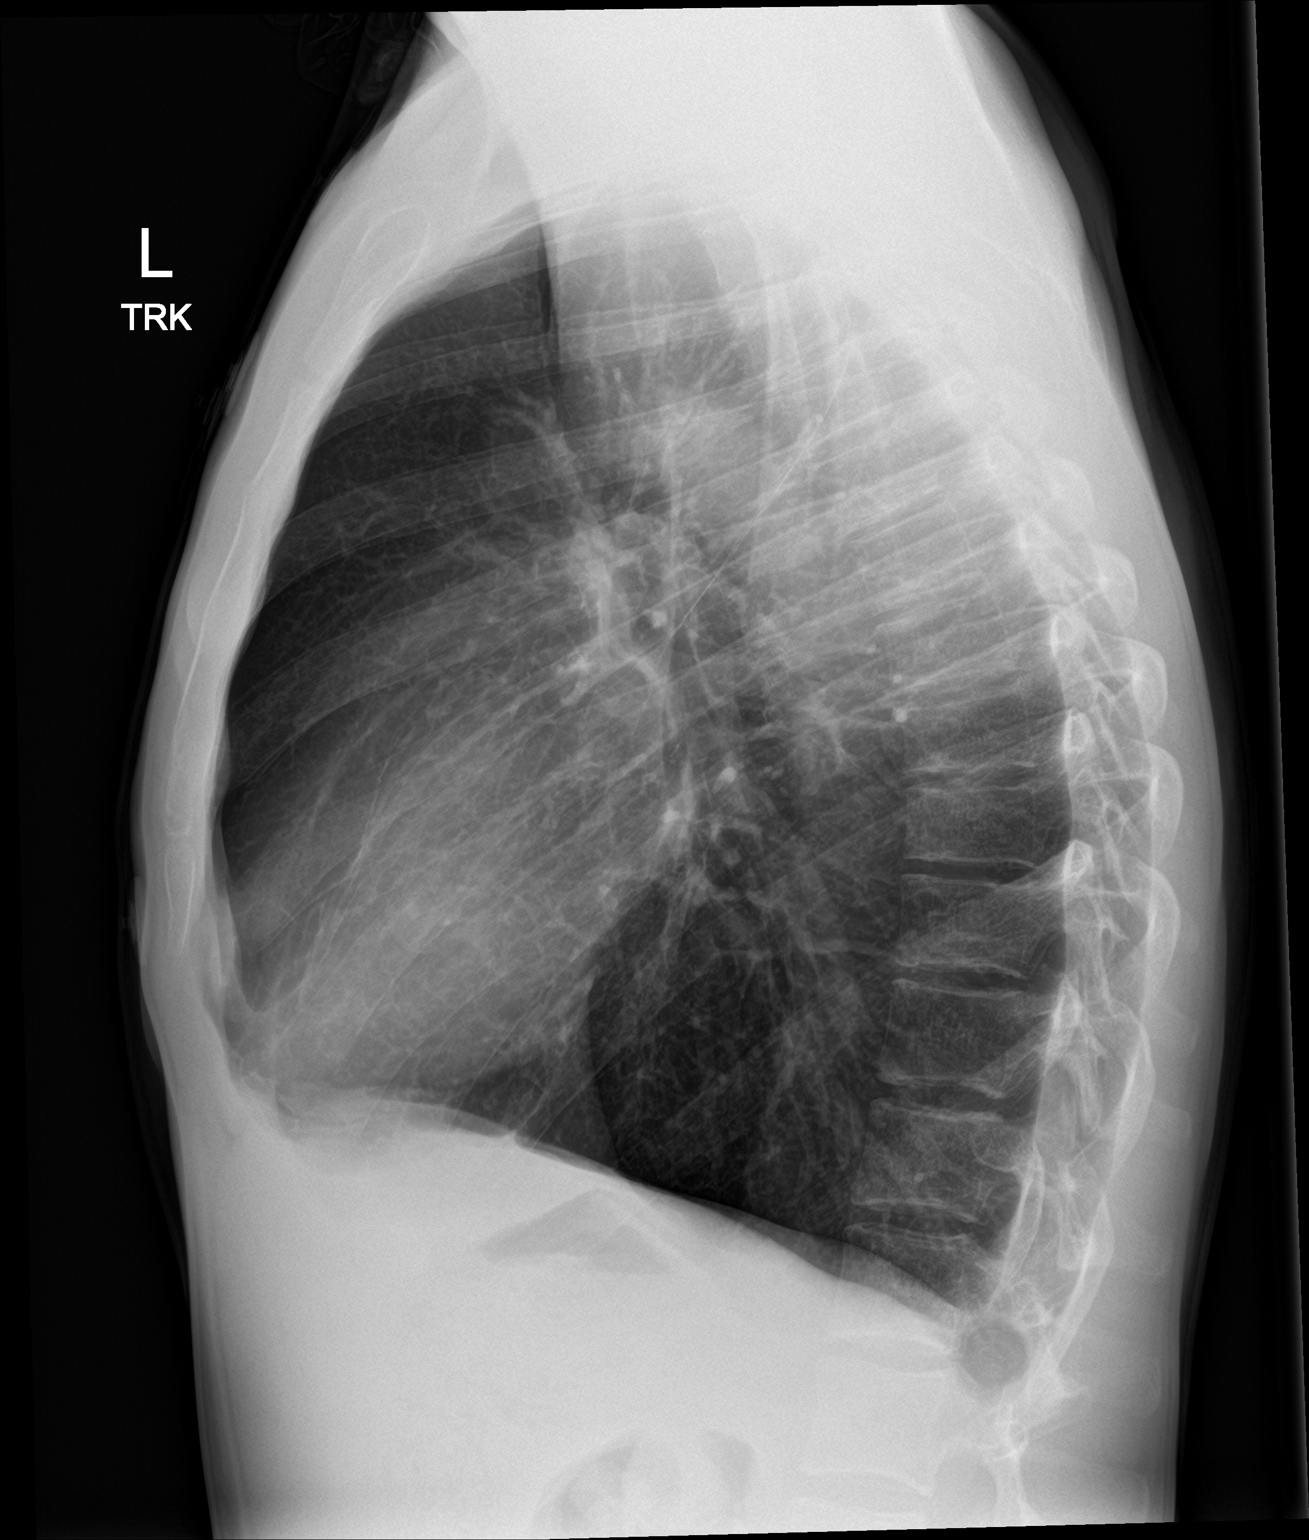

[2 of 2 positions shown; findings below may reference images not displayed]

FINDINGS: Large lung volumes. Normal cardiac size and mediastinal contours.
Visualized tracheal air column is within normal limits. No
pneumothorax, pulmonary edema, pleural effusion or confluent
pulmonary opacity. Mild diffuse increased pulmonary interstitial
markings with a perihilar predominance.

No acute osseous abnormality identified. Negative visible bowel gas
pattern.
IMPRESSION: Hyperinflated lungs. Mild prominence of the pulmonary interstitium
likely secondary to smoking. No acute cardiopulmonary abnormality.

## 2021-05-28 DIAGNOSIS — Z0389 Encounter for observation for other suspected diseases and conditions ruled out: Secondary | ICD-10-CM | POA: Diagnosis not present

## 2021-05-28 DIAGNOSIS — Z1388 Encounter for screening for disorder due to exposure to contaminants: Secondary | ICD-10-CM | POA: Diagnosis not present

## 2021-05-28 DIAGNOSIS — Z3009 Encounter for other general counseling and advice on contraception: Secondary | ICD-10-CM | POA: Diagnosis not present

## 2021-07-13 IMAGING — CR DG CHEST 2V
2 series · 2 of 2 positions shown · non-contrast
Comparison: None.

CLINICAL DATA: Shortness of breath.

EXAM:
CHEST - 2 VIEW

[chest pa]
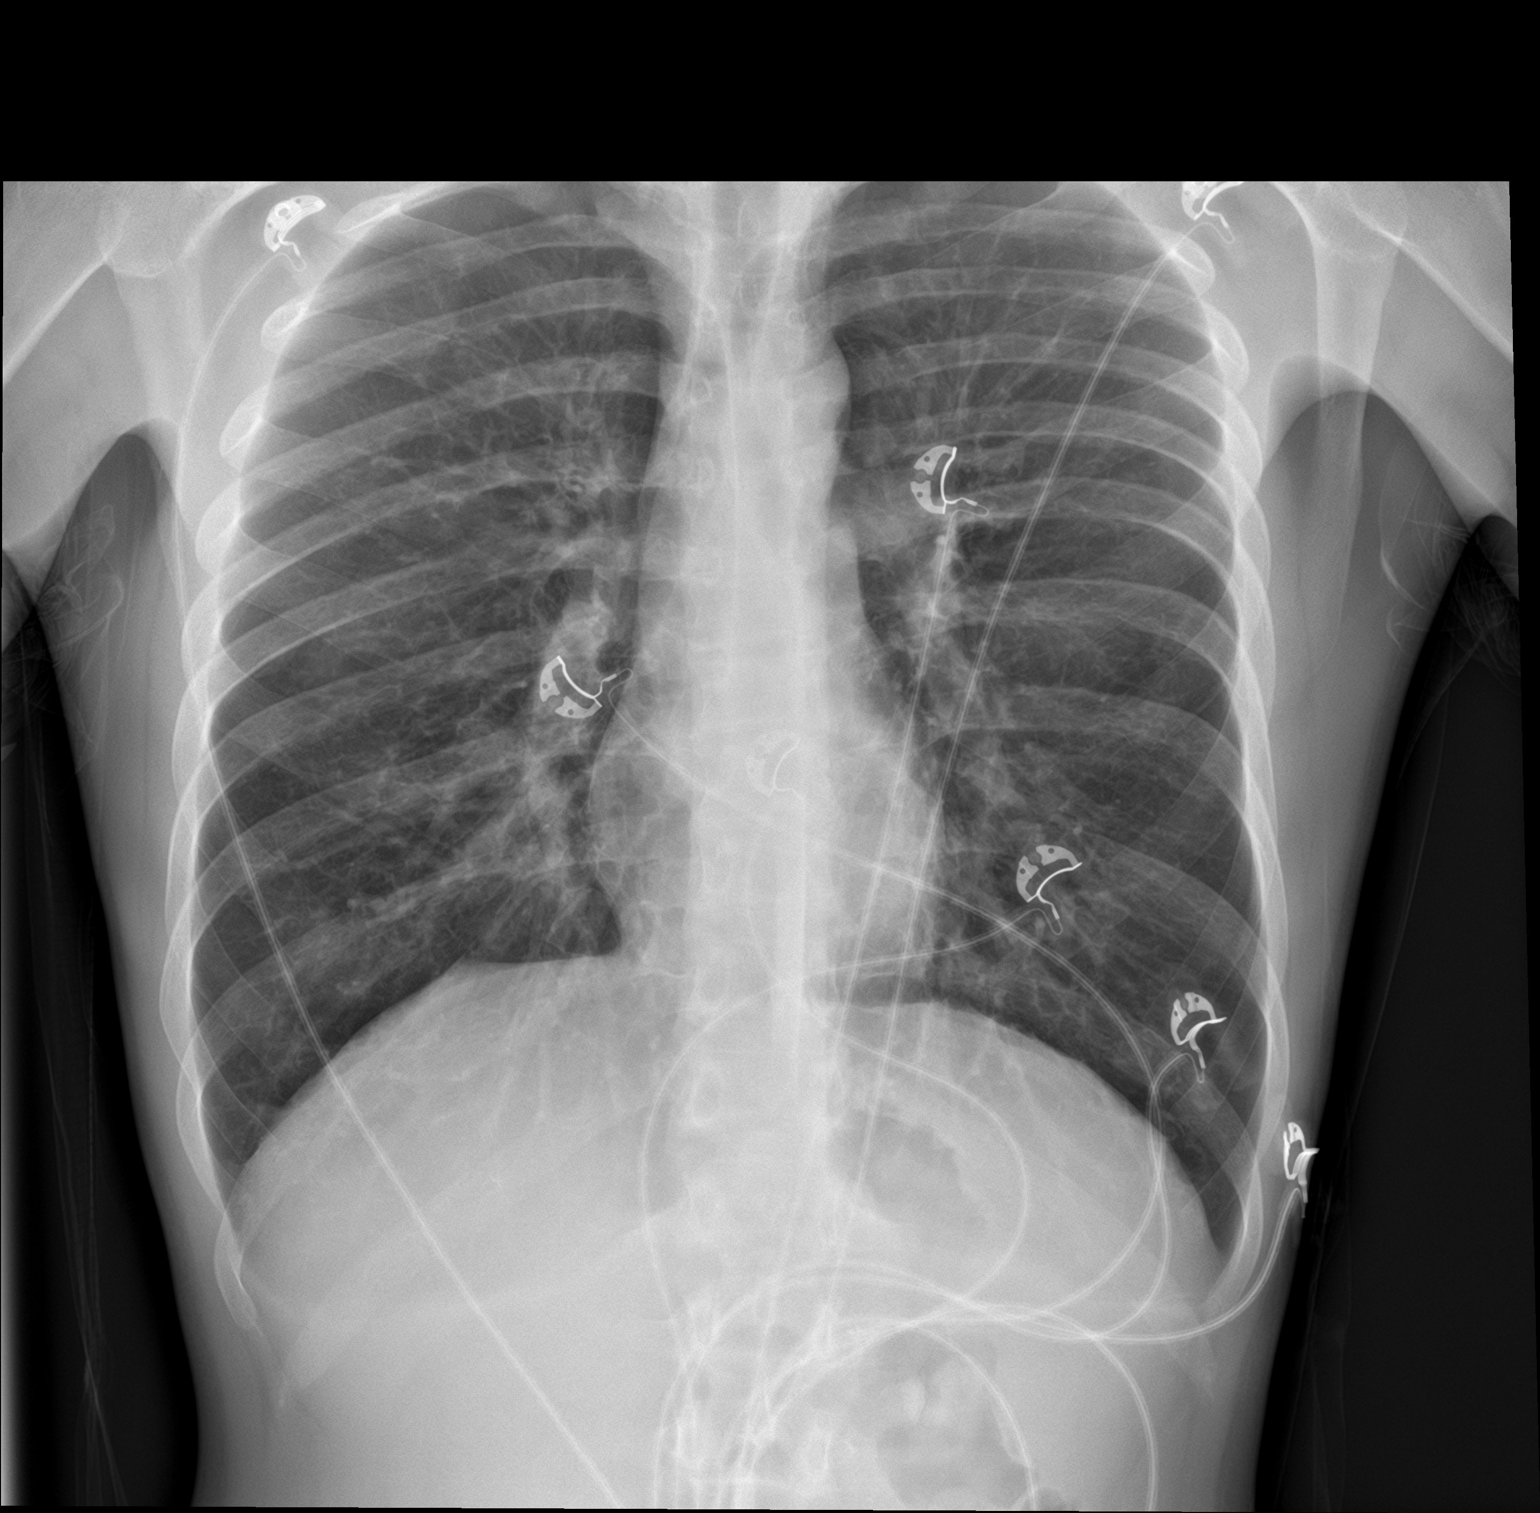

[chest lat]
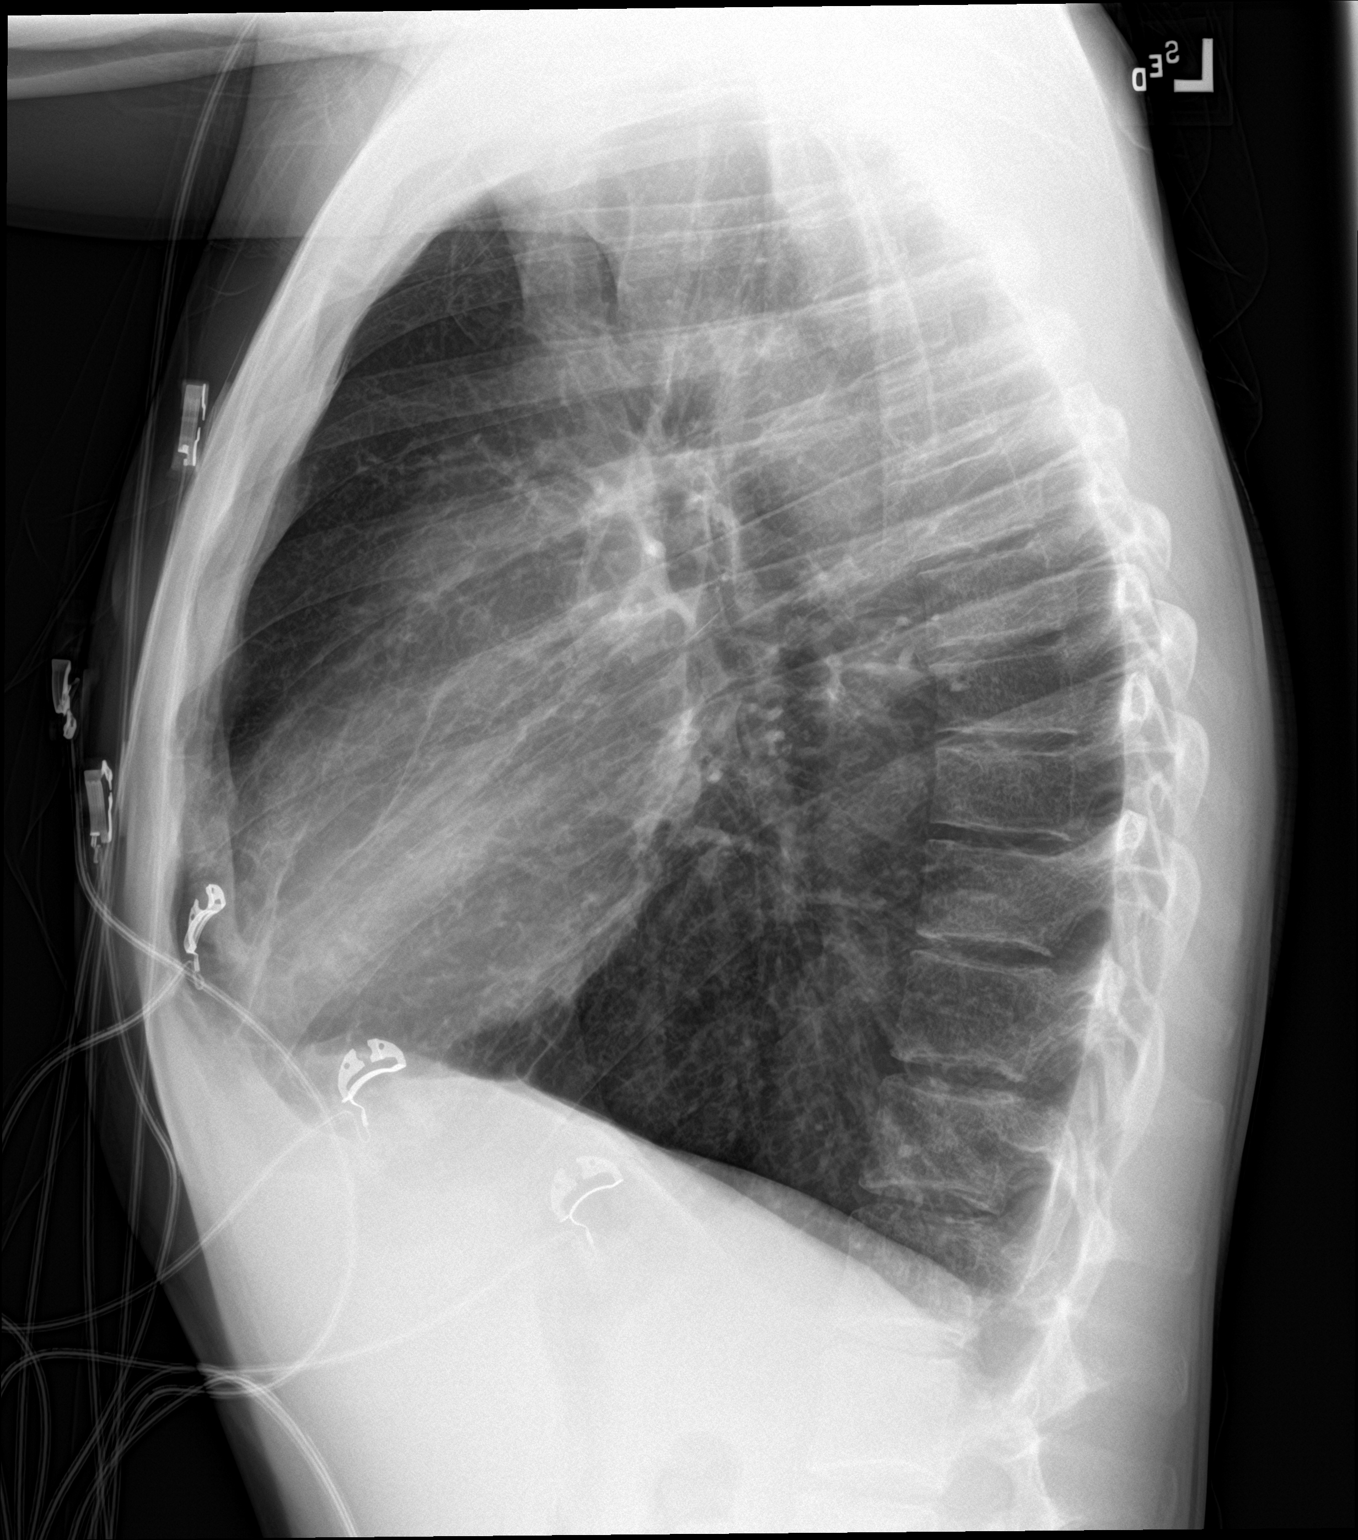

[2 of 2 positions shown; findings below may reference images not displayed]

FINDINGS: The lungs are hyperinflated. Mildly increased suprahilar and
infrahilar interstitial lung markings are seen. This is stable in
appearance when compared to the prior study. There is no evidence of
acute infiltrate, pleural effusion or pneumothorax. The heart size
and mediastinal contours are within normal limits. The visualized
skeletal structures are unremarkable.
IMPRESSION: 1. Stable exam without acute or active cardiopulmonary disease.

## 2022-01-02 DIAGNOSIS — J45909 Unspecified asthma, uncomplicated: Secondary | ICD-10-CM | POA: Diagnosis not present

## 2022-01-02 DIAGNOSIS — F17210 Nicotine dependence, cigarettes, uncomplicated: Secondary | ICD-10-CM | POA: Diagnosis not present

## 2022-01-02 DIAGNOSIS — Z008 Encounter for other general examination: Secondary | ICD-10-CM | POA: Diagnosis not present
